# Patient Record
Sex: Female | Born: 1965 | Race: White | Hispanic: No | Marital: Married | Smoking: Former smoker
Health system: Southern US, Community
[De-identification: ages and names within clinical notes are randomized; demographics above are authoritative.]

## PROBLEM LIST (undated history)

## (undated) DIAGNOSIS — E785 Hyperlipidemia, unspecified: Secondary | ICD-10-CM

## (undated) DIAGNOSIS — I10 Essential (primary) hypertension: Secondary | ICD-10-CM

## (undated) HISTORY — DX: Essential (primary) hypertension: I10

## (undated) HISTORY — DX: Hyperlipidemia, unspecified: E78.5

---

## 2010-03-18 ENCOUNTER — Ambulatory Visit: Payer: Self-pay | Admitting: Family Medicine

## 2014-08-17 ENCOUNTER — Ambulatory Visit: Payer: Self-pay | Admitting: Physician Assistant

## 2015-09-28 ENCOUNTER — Encounter: Payer: Self-pay | Admitting: Family Medicine

## 2015-09-28 ENCOUNTER — Ambulatory Visit (INDEPENDENT_AMBULATORY_CARE_PROVIDER_SITE_OTHER): Payer: BLUE CROSS/BLUE SHIELD | Admitting: Family Medicine

## 2015-09-28 VITALS — BP 120/80 | HR 72 | Ht 68.0 in | Wt 248.0 lb

## 2015-09-28 DIAGNOSIS — J40 Bronchitis, not specified as acute or chronic: Secondary | ICD-10-CM

## 2015-09-28 DIAGNOSIS — IMO0002 Reserved for concepts with insufficient information to code with codable children: Secondary | ICD-10-CM | POA: Insufficient documentation

## 2015-09-28 DIAGNOSIS — J0100 Acute maxillary sinusitis, unspecified: Secondary | ICD-10-CM | POA: Diagnosis not present

## 2015-09-28 DIAGNOSIS — E783 Hyperchylomicronemia: Secondary | ICD-10-CM | POA: Insufficient documentation

## 2015-09-28 DIAGNOSIS — I10 Essential (primary) hypertension: Secondary | ICD-10-CM | POA: Insufficient documentation

## 2015-09-28 MED ORDER — GUAIFENESIN-CODEINE 100-10 MG/5ML PO SOLN: 5.0000 mL | Freq: Three times a day (TID) | ORAL | Status: DC | PRN

## 2015-09-28 MED ORDER — AMOXICILLIN-POT CLAVULANATE 875-125 MG PO TABS: 1.0000 | ORAL_TABLET | Freq: Two times a day (BID) | ORAL | Status: DC

## 2015-09-28 MED ORDER — ALBUTEROL SULFATE HFA 108 (90 BASE) MCG/ACT IN AERS: 2.0000 | INHALATION_SPRAY | Freq: Four times a day (QID) | RESPIRATORY_TRACT | Status: DC | PRN

## 2015-09-28 NOTE — Progress Notes (Signed)
Name: Alice Carey   MRN: [id number]    DOB: [birth year]   Date:09/28/2015       Progress Note  Subjective  Chief Complaint  Chief Complaint  Patient presents with  . Sinusitis    cough and congestion- can hear a "sloshing in R) ear"- green production x 5 days    Sinusitis This is a new problem. The current episode started in the past 7 days. The problem has been waxing and waning since onset. The maximum temperature recorded prior to her arrival was 100.4 - 100.9 F. The fever has been present for 1 to 2 days. Her pain is at a severity of 2/10. The pain is mild. Associated symptoms include chills, congestion, coughing, ear pain, sinus pressure, sneezing, a sore throat and swollen glands. Pertinent negatives include no diaphoresis, headaches, hoarse voice, neck pain or shortness of breath. ("crackling") Past treatments include acetaminophen and oral decongestants. The treatment provided mild relief.  Cough This is a new problem. The cough is productive of sputum (green). Associated symptoms include chills, ear pain, nasal congestion, postnasal drip, a sore throat and wheezing. Pertinent negatives include no chest pain, fever, headaches, heartburn, myalgias, rash, shortness of breath or weight loss. The symptoms are aggravated by cold air. She has tried OTC cough suppressant for the symptoms. The treatment provided no relief. There is no history of environmental allergies.    No problem-specific assessment & plan notes found for this encounter.   Past Medical History  Diagnosis Date  . Hyperlipidemia   . Hypertension     History reviewed. No pertinent past surgical history.  Family History  Problem Relation Age of Onset  . Hypertension Mother   . Diabetes Son     Social History   Social History  . Marital Status: Married    Spouse Name: N/A  . Number of Children: N/A  . Years of Education: N/A   Occupational History  . Not on file.   Social History Main Topics  .  Smoking status: Former Games developer  . Smokeless tobacco: Not on file  . Alcohol Use: No  . Drug Use: No  . Sexual Activity: Yes   Other Topics Concern  . Not on file   Social History Narrative  . No narrative on file    Allergies  Allergen Reactions  . Tetracyclines & Related Nausea Only     Review of Systems  Constitutional: Positive for chills. Negative for fever, weight loss, malaise/fatigue and diaphoresis.  HENT: Positive for congestion, ear pain, postnasal drip, sinus pressure, sneezing and sore throat. Negative for ear discharge and hoarse voice.   Eyes: Negative for blurred vision.  Respiratory: Positive for cough and wheezing. Negative for sputum production and shortness of breath.   Cardiovascular: Negative for chest pain, palpitations and leg swelling.  Gastrointestinal: Negative for heartburn, nausea, abdominal pain, diarrhea, constipation, blood in stool and melena.  Genitourinary: Negative for dysuria, urgency, frequency and hematuria.  Musculoskeletal: Negative for myalgias, back pain, joint pain and neck pain.  Skin: Negative for rash.  Neurological: Negative for dizziness, tingling, sensory change, focal weakness and headaches.  Endo/Heme/Allergies: Negative for environmental allergies and polydipsia. Does not bruise/bleed easily.  Psychiatric/Behavioral: Negative for depression and suicidal ideas. The patient is not nervous/anxious and does not have insomnia.      Objective  Filed Vitals:   09/28/15 1549  BP: 120/80  Pulse: 72  Height:  (1.727 m)  Weight: 248 lb (112.492 kg)    Physical Exam  Constitutional: She is well-developed, well-nourished, and in no distress. No distress.  HENT:  Head: Normocephalic and atraumatic.  Right Ear: External ear normal.  Left Ear: External ear normal.  Nose: Nose normal.  Mouth/Throat: Oropharynx is clear and moist.  Eyes: Conjunctivae and EOM are normal. Pupils are equal, round, and reactive to light. Right eye  exhibits no discharge. Left eye exhibits no discharge.  Neck: Normal range of motion. Neck supple. No JVD present. No thyromegaly present.  Cardiovascular: Normal rate, regular rhythm, normal heart sounds and intact distal pulses.  Exam reveals no gallop and no friction rub.   No murmur heard. Pulmonary/Chest: Effort normal and breath sounds normal.  Abdominal: Soft. Bowel sounds are normal. She exhibits no mass. There is no tenderness. There is no guarding.  Musculoskeletal: Normal range of motion. She exhibits no edema.  Lymphadenopathy:    She has no cervical adenopathy.  Neurological: She is alert.  Skin: Skin is warm and dry. She is not diaphoretic.  Psychiatric: Mood and affect normal.  Nursing note and vitals reviewed.     Assessment & Plan  Problem List Items Addressed This Visit    None    Visit Diagnoses    Acute maxillary sinusitis, recurrence not specified    -  Primary    Relevant Medications    doxycycline (VIBRA-TABS) 100 MG tablet    amoxicillin-clavulanate (AUGMENTIN) 875-125 MG tablet    guaiFENesin-codeine 100-10 MG/5ML syrup    Bronchitis        Relevant Medications    guaiFENesin-codeine 100-10 MG/5ML syrup         Dr. Hayden Rasmusseneanna Anagabriela Jokerst [facility]  09/28/2015

## 2015-10-07 ENCOUNTER — Encounter: Payer: Self-pay | Admitting: Family Medicine

## 2015-10-07 ENCOUNTER — Ambulatory Visit (INDEPENDENT_AMBULATORY_CARE_PROVIDER_SITE_OTHER): Payer: BLUE CROSS/BLUE SHIELD | Admitting: Family Medicine

## 2015-10-07 VITALS — BP 100/64 | HR 68 | Ht 68.0 in | Wt 248.0 lb

## 2015-10-07 DIAGNOSIS — J0100 Acute maxillary sinusitis, unspecified: Secondary | ICD-10-CM

## 2015-10-07 DIAGNOSIS — J219 Acute bronchiolitis, unspecified: Secondary | ICD-10-CM | POA: Diagnosis not present

## 2015-10-07 MED ORDER — BENZONATATE 100 MG PO CAPS: 100.0000 mg | ORAL_CAPSULE | Freq: Two times a day (BID) | ORAL | Status: DC | PRN

## 2015-10-07 MED ORDER — GUAIFENESIN-CODEINE 100-10 MG/5ML PO SOLN: 5.0000 mL | Freq: Three times a day (TID) | ORAL | Status: DC | PRN

## 2015-10-07 NOTE — Progress Notes (Signed)
Name: Alice Carey   MRN: [id number]    DOB: Jun 19, 1966   Date:10/07/2015       Progress Note  Subjective  Chief Complaint  Chief Complaint  Patient presents with  . Cough    Cough This is a recurrent problem. The current episode started 1 to 4 weeks ago. The problem has been waxing and waning. The problem occurs constantly. The cough is non-productive. Associated symptoms include nasal congestion, postnasal drip and rhinorrhea. Pertinent negatives include no chest pain, chills, ear congestion, ear pain, fever, headaches, heartburn, hemoptysis, myalgias, rash, sore throat, shortness of breath, sweats, weight loss or wheezing. The symptoms are aggravated by cold air and lying down. She has tried a beta-agonist inhaler for the symptoms. The treatment provided mild relief. Her past medical history is significant for asthma and bronchitis. There is no history of COPD or environmental allergies.    No problem-specific assessment & plan notes found for this encounter.   Past Medical History  Diagnosis Date  . Hyperlipidemia   . Hypertension     History reviewed. No pertinent past surgical history.  Family History  Problem Relation Age of Onset  . Hypertension Mother   . Diabetes Son     Social History   Social History  . Marital Status: Married    Spouse Name: N/A  . Number of Children: N/A  . Years of Education: N/A   Occupational History  . Not on file.   Social History Main Topics  . Smoking status: Former Games developer  . Smokeless tobacco: Not on file  . Alcohol Use: No  . Drug Use: No  . Sexual Activity: Yes   Other Topics Concern  . Not on file   Social History Narrative    Allergies  Allergen Reactions  . Tetracyclines & Related Nausea Only     Review of Systems  Constitutional: Negative for fever, chills, weight loss and malaise/fatigue.  HENT: Positive for postnasal drip and rhinorrhea. Negative for ear discharge, ear pain and sore throat.   Eyes:  Negative for blurred vision.  Respiratory: Positive for cough. Negative for hemoptysis, sputum production, shortness of breath and wheezing.   Cardiovascular: Negative for chest pain, palpitations and leg swelling.  Gastrointestinal: Negative for heartburn, nausea, abdominal pain, diarrhea, constipation, blood in stool and melena.  Genitourinary: Negative for dysuria, urgency, frequency and hematuria.  Musculoskeletal: Negative for myalgias, back pain, joint pain and neck pain.  Skin: Negative for rash.  Neurological: Negative for dizziness, tingling, sensory change, focal weakness and headaches.  Endo/Heme/Allergies: Negative for environmental allergies and polydipsia. Does not bruise/bleed easily.  Psychiatric/Behavioral: Negative for depression and suicidal ideas. The patient is not nervous/anxious and does not have insomnia.      Objective  Filed Vitals:   10/07/15 1545  BP: 100/64  Pulse: 68  Height:  (1.727 m)  Weight: 248 lb (112.492 kg)  SpO2: 97%    Physical Exam  Constitutional: She is well-developed, well-nourished, and in no distress. No distress.  HENT:  Head: Normocephalic and atraumatic.  Right Ear: External ear normal.  Left Ear: External ear normal.  Nose: Nose normal.  Mouth/Throat: Oropharynx is clear and moist.  Eyes: Conjunctivae and EOM are normal. Pupils are equal, round, and reactive to light. Right eye exhibits no discharge. Left eye exhibits no discharge.  Neck: Normal range of motion. Neck supple. No JVD present. No thyromegaly present.  Cardiovascular: Normal rate, regular rhythm, normal heart sounds and intact distal pulses.  Exam reveals  no gallop and no friction rub.   No murmur heard. Pulmonary/Chest: Effort normal and breath sounds normal. No respiratory distress. She has no wheezes. She has no rales. She exhibits no tenderness.  Abdominal: Soft. Bowel sounds are normal. She exhibits no mass. There is no tenderness. There is no guarding.   Musculoskeletal: Normal range of motion. She exhibits no edema.  Lymphadenopathy:    She has no cervical adenopathy.  Neurological: She is alert. She has normal reflexes.  Skin: Skin is warm and dry. She is not diaphoretic.  Psychiatric: Mood and affect normal.  Nursing note and vitals reviewed.     Assessment & Plan  Problem List Items Addressed This Visit    None    Visit Diagnoses    Bronchiolitis    -  Primary    Breo sample    Acute maxillary sinusitis, recurrence not specified        Relevant Medications    benzonatate (TESSALON) 100 MG capsule    guaiFENesin-codeine 100-10 MG/5ML syrup         Dr. Hayden Rasmusseneanna Elie Leppo [facility]  10/07/2015

## 2016-01-27 IMAGING — CR RIGHT FOOT COMPLETE - 3+ VIEW
3 series · 3 of 3 positions shown · non-contrast
Comparison: None.

CLINICAL DATA: Pain on the top of the right midfoot for 3 weeks. No
known injury. Remote history of a right ankle fracture.

EXAM:
RIGHT FOOT COMPLETE - 3+ VIEW

[foot ap]
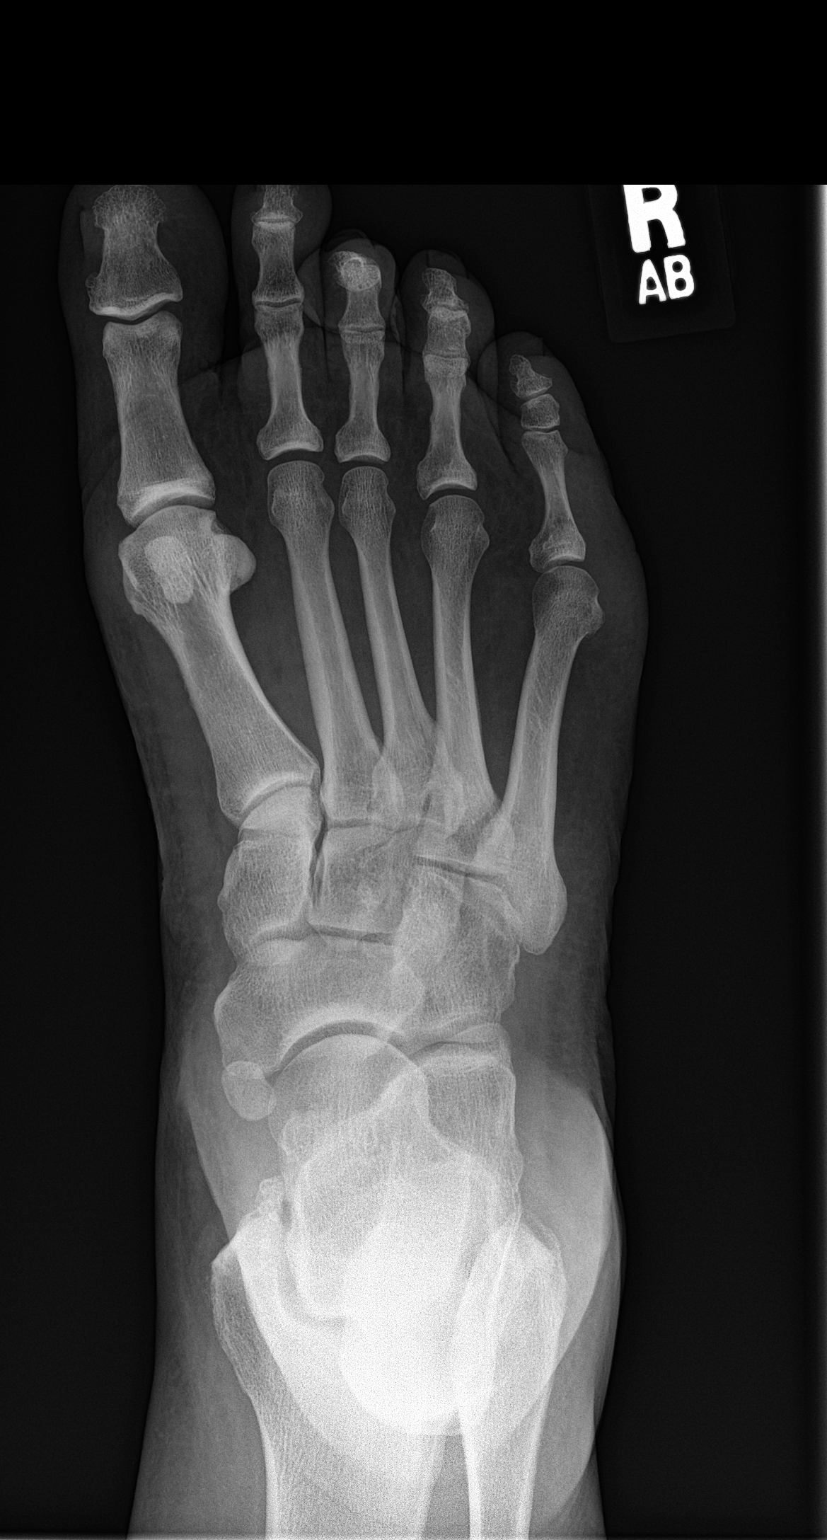

[foot obl]
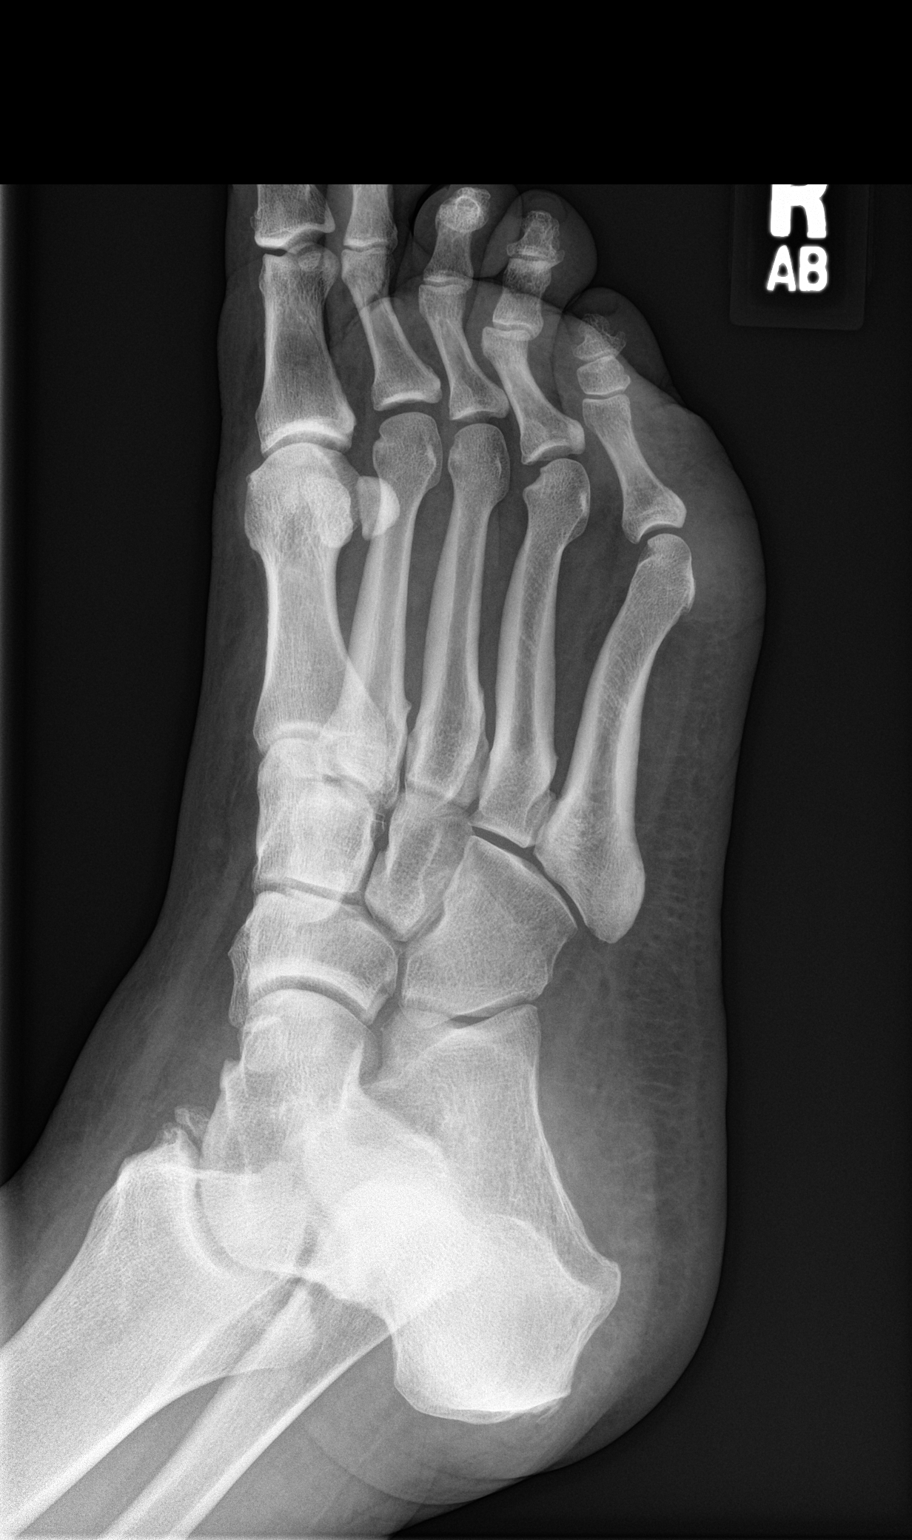

[foot lat]
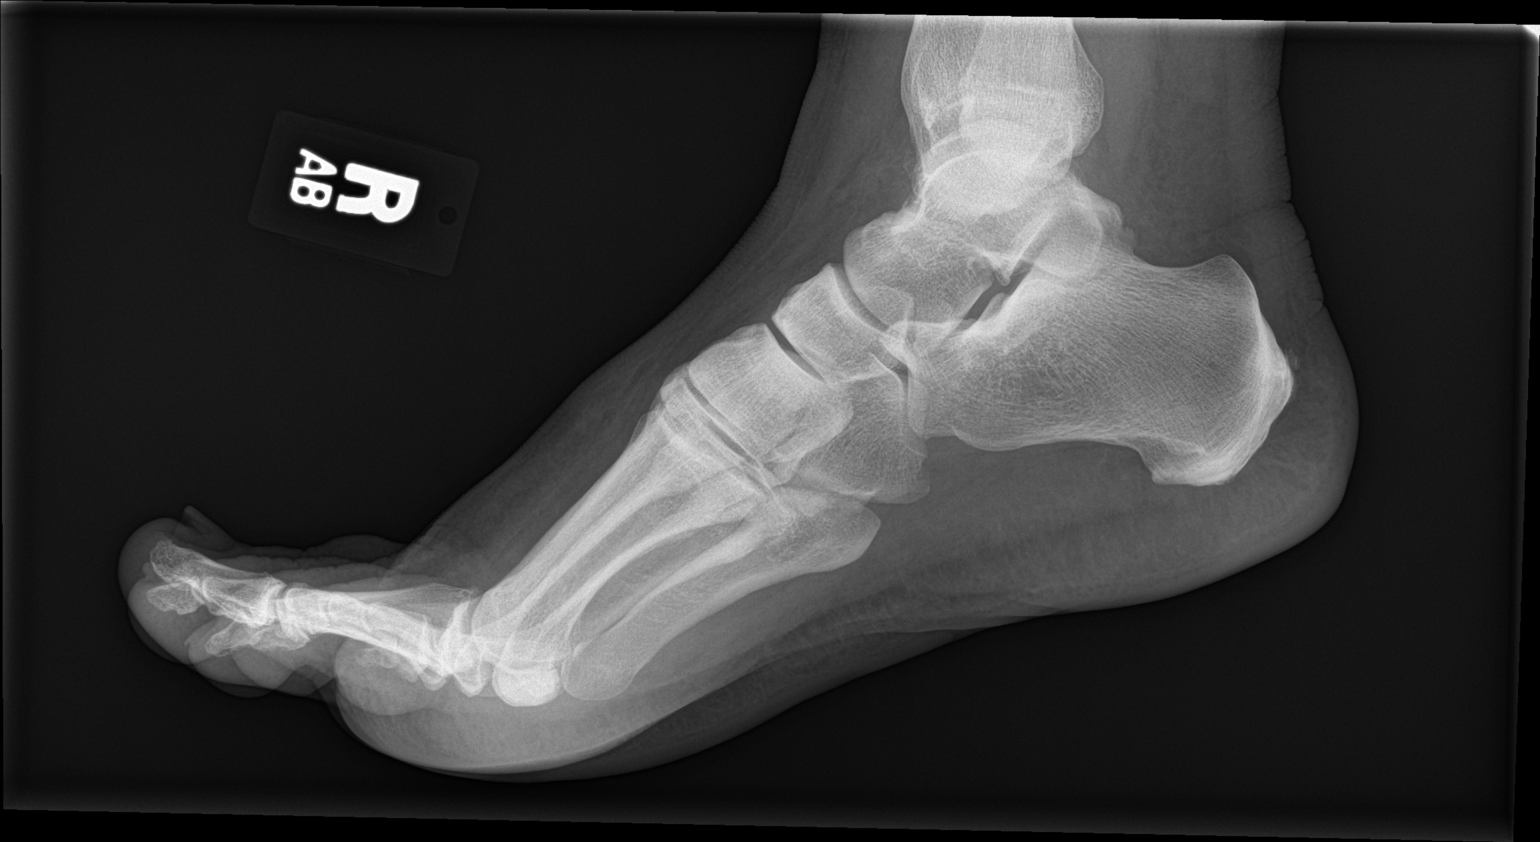

[3 of 3 positions shown; findings below may reference images not displayed]

FINDINGS: No acute fracture. Joints are normally spaced and aligned. Mild
diffuse soft tissue edema is noted.
IMPRESSION: No fracture or joint abnormality.

## 2016-10-23 ENCOUNTER — Ambulatory Visit (INDEPENDENT_AMBULATORY_CARE_PROVIDER_SITE_OTHER): Payer: BLUE CROSS/BLUE SHIELD

## 2016-10-23 ENCOUNTER — Ambulatory Visit
Admission: EM | Admit: 2016-10-23 | Discharge: 2016-10-23 | Discharge status: Against Medical Advice | Payer: BLUE CROSS/BLUE SHIELD | Attending: Emergency Medicine | Admitting: Emergency Medicine

## 2016-10-23 DIAGNOSIS — J189 Pneumonia, unspecified organism: Secondary | ICD-10-CM

## 2016-10-23 LAB — CBC WITH DIFFERENTIAL/PLATELET
BASOS PCT: 0 %
Basophils Absolute: 0 10*3/uL (ref 0–0.1)
Eosinophils Absolute: 0 10*3/uL (ref 0–0.7)
Eosinophils Relative: 0 %
HEMATOCRIT: 40.4 % (ref 35.0–47.0)
Hemoglobin: 13.9 g/dL (ref 12.0–16.0)
LYMPHS ABS: 0.8 10*3/uL — AB (ref 1.0–3.6)
Lymphocytes Relative: 7 %
MCH: 31.6 pg (ref 26.0–34.0)
MCHC: 34.3 g/dL (ref 32.0–36.0)
MCV: 92.2 fL (ref 80.0–100.0)
MONO ABS: 1.1 10*3/uL — AB (ref 0.2–0.9)
MONOS PCT: 10 %
NEUTROS ABS: 9.3 10*3/uL — AB (ref 1.4–6.5)
Neutrophils Relative %: 83 %
Platelets: 280 10*3/uL (ref 150–440)
RBC: 4.38 MIL/uL (ref 3.80–5.20)
RDW: 13.2 % (ref 11.5–14.5)
WBC: 11.3 10*3/uL — ABNORMAL HIGH (ref 3.6–11.0)

## 2016-10-23 LAB — BASIC METABOLIC PANEL
Anion gap: 13 (ref 5–15)
BUN: 29 mg/dL — AB (ref 6–20)
CALCIUM: 9 mg/dL (ref 8.9–10.3)
CHLORIDE: 94 mmol/L — AB (ref 101–111)
CO2: 29 mmol/L (ref 22–32)
CREATININE: 1 mg/dL (ref 0.44–1.00)
GFR calc non Af Amer: >60 mL/min (ref >60)
GLUCOSE: 128 mg/dL — AB (ref 65–99)
Potassium: 3.4 mmol/L — ABNORMAL LOW (ref 3.5–5.1)
Sodium: 136 mmol/L (ref 135–145)

## 2016-10-23 LAB — RAPID INFLUENZA A&B ANTIGENS (ARMC ONLY)
INFLUENZA A (ARMC): NEGATIVE
INFLUENZA B (ARMC): NEGATIVE

## 2016-10-23 MED ORDER — LEVOFLOXACIN 750 MG PO TABS: 750.0000 mg | ORAL_TABLET | Freq: Every day | ORAL | 0 refills | Status: AC

## 2016-10-23 MED ORDER — SODIUM CHLORIDE 0.9 % IV BOLUS (SEPSIS)
1000.0000 mL | Freq: Once | INTRAVENOUS | Status: AC
  Administered 2016-10-23: 1000 mL via INTRAVENOUS

## 2016-10-23 MED ORDER — GUAIFENESIN-CODEINE 100-10 MG/5ML PO SYRP: 5.0000 mL | ORAL_SOLUTION | Freq: Four times a day (QID) | ORAL | 0 refills | Status: DC | PRN

## 2016-10-23 MED ORDER — ONDANSETRON 8 MG PO TBDP: 8.0000 mg | ORAL_TABLET | Freq: Three times a day (TID) | ORAL | 0 refills | Status: AC | PRN

## 2016-10-23 MED ORDER — LEVOFLOXACIN 750 MG PO TABS
750.0000 mg | ORAL_TABLET | Freq: Every day | ORAL | Status: DC
  Administered 2016-10-23: 750 mg via ORAL

## 2016-10-23 MED ORDER — ACETAMINOPHEN 500 MG PO TABS
1000.0000 mg | ORAL_TABLET | Freq: Once | ORAL | Status: AC
  Administered 2016-10-23: 1000 mg via ORAL

## 2016-10-23 NOTE — Discharge Instructions (Signed)
2 puffs from your albuterol inhaler every 4-6 hours as needed for coughing, wheezing. Make sure you finish all of the antibiotics. You may start the antibiotics tomorrow as you have already taken today's dose. Push fluids. Go to the ER for persistent heart rate above 100, if your systolic blood pressure drops below 90, fevers despite being on the antibiotics, or other concerns.

## 2016-10-23 NOTE — ED Provider Notes (Signed)
HPI  SUBJECTIVE:  Alice Carey is a 51 y.o. female who presents with fevers Tmax 103 with chills, fatigue, cough productive of white phlegm, shortness of breath, wheezing last night, and dyspnea on exertion at 20 feet. She reports diffuse, constant chest pain described as burning, sore, pressure with no exertional positional component. There is no diaphoresis with this. She reports "entire back pain", body aches, frontal headache that is intermittent, present only with coughing, lasting a minute or 2 and then resolving. She denies neck stiffness or photophobia. She reports nausea, decreased appetite, but is tolerating by mouth. She reports posttussive emesis, but no other vomiting. Reports abdominal soreness from all the coughing, denies abdominal pain. No diarrhea. She denies lower extremity edema, calf pain, swelling, nocturia, PND, orthopnea. She has been taking Tylenol thousand milligrams and Advil 800 mg. Tylenol helps. Last dose of Tylenol was 3 hours prior to evaluation. No aggravating factors. She states that she was treated prophylactically with Tamiflu last week because several family members had influenza, but she states that she is getting worse. She has a past medical history of hypertension, normally runs 120/70. Patient states that she is not taken her medication in a week. She has remote history of pneumonia. No history of diabetes, asthma, emphysema, COPD. She is not a smoker. History of immunocompromise, HIV, cancer, UTI, pyelonephritis, hypercholesterolemia, MI, CHF. PMD: Dr. Elmer Ramp    Past Medical History:  Diagnosis Date  . Hyperlipidemia   . Hypertension     History reviewed. No pertinent surgical history.  Family History  Problem Relation Age of Onset  . Hypertension Mother   . Diabetes Son     Social History  Substance Use Topics  . Smoking status: Former Games developer  . Smokeless tobacco: Never Used  . Alcohol use No    No current facility-administered  medications for this encounter.   Current Outpatient Prescriptions:  .  albuterol (PROVENTIL HFA;VENTOLIN HFA) 108 (90 BASE) MCG/ACT inhaler, Inhale 2 puffs into the lungs every 6 (six) hours as needed for wheezing or shortness of breath., Disp: 1 Inhaler, Rfl: 2 .  amLODipine (NORVASC) 10 MG tablet, Take 1 tablet by mouth daily. Dr Elmer Ramp, Disp: , Rfl:  .  atenolol (TENORMIN) 50 MG tablet, Take 1 tablet by mouth daily., Disp: , Rfl:  .  gemfibrozil (LOPID) 600 MG tablet, Take 1 tablet by mouth 2 (two) times daily., Disp: , Rfl:  .  hydrochlorothiazide (HYDRODIURIL) 25 MG tablet, Take 1 tablet by mouth daily., Disp: , Rfl:  .  oseltamivir (TAMIFLU) 30 MG capsule, Take 30 mg by mouth., Disp: , Rfl:  .  telmisartan (MICARDIS) 80 MG tablet, Take 1 tablet by mouth daily., Disp: , Rfl:   Allergies  Allergen Reactions  . Tetracyclines & Related Nausea Only     ROS  As noted in HPI.   Physical Exam  BP (!) 93/55   Pulse 100   Temp 98.9 F (37.2 C) (Oral)   Resp 18   Ht 5\' 7"  (1.702 m)   Wt 240 lb (108.9 kg)   LMP 09/06/2015 (Exact Date) Comment: denies preg  SpO2 99%   BMI 37.59 kg/m  Vitals:   10/23/16 0927 10/23/16 0928 10/23/16 1022 10/23/16 1315  BP: (!) 93/51  (!) 93/55 (!) 108/57  Pulse: (!) 110  100 (!) 102  Resp: 18   20  Temp: 98.9 F (37.2 C)   100 F (37.8 C)  TempSrc: Oral     SpO2: 99%  99%  Weight:  240 lb (108.9 kg)    Height:  5\' 7"  (1.702 m)     BP Readings from Last 3 Encounters:  10/23/16 (!) 93/55  10/07/15 100/64  09/28/15 120/80   06/27/2016 125/70 12/13/2015 114/70  Constitutional: Well developed, well nourished, no acute distress Appears ill. Coughing.  Eyes: PERRL, EOMI, conjunctiva normal bilaterally HENT: Normocephalic, atraumatic,mucus membranes moist. Mild nasal congestion. No sinus tenderness. Respiratory: Crackles right lower lobe, no rales, no wheezing, no rhonchi. Diffuse chest wall tenderness  Cardiovascular. regular tachycardia, no  murmurs, no gallops, no rubs. Radial pulse 2+ and regular.  GI: Soft, nondistended, normal bowel sounds, nontender, no rebound, no guarding Back: no CVAT skin: No rash, skin intact Musculoskeletal: no deformities Neurologic: Alert & oriented x 3, CN II-XII grossly intact, no motor deficits, sensation grossly intact Psychiatric: Speech and behavior appropriate   ED Course   Medications - No data to display  Orders Placed This Encounter  Procedures  . Rapid Influenza A&B Antigens ([facility] only)    Standing Status:   Standing    Number of Occurrences:   1  . DG Chest 2 View    Standing Status:   Standing    Number of Occurrences:   1    Order Specific Question:   Reason for Exam (SYMPTOM  OR DIAGNOSIS REQUIRED)    Answer:   fever cough crackles RLL r/o PNA  . CBC with Differential    Standing Status:   Standing    Number of Occurrences:   1  . Basic metabolic panel    Standing Status:   Standing    Number of Occurrences:   1  . Vital signs    Standing Status:   Standing    Number of Occurrences:   1  . Droplet precaution    Standing Status:   Standing    Number of Occurrences:   1   Results for orders placed or performed during the hospital encounter of 10/23/16 (from the past 24 hour(s))  Rapid Influenza A&B Antigens ([facility] only)     Status: None   Collection Time: 10/23/16  9:28 AM  Result Value Ref Range   Influenza A ([facility]) NEGATIVE NEGATIVE   Influenza B ([facility]) NEGATIVE NEGATIVE  CBC with Differential     Status: Abnormal   Collection Time: 10/23/16 10:11 AM  Result Value Ref Range   WBC 11.3 (H) 3.6 - 11.0 K/uL   RBC 4.38 3.80 - 5.20 MIL/uL   Hemoglobin 13.9 12.0 - 16.0 g/dL   HCT 40.9 81.1 - 91.4 %   MCV 92.2 80.0 - 100.0 fL   MCH 31.6 26.0 - 34.0 pg   MCHC 34.3 32.0 - 36.0 g/dL   RDW 78.2 95.6 - 21.3 %   Platelets 280 150 - 440 K/uL   Neutrophils Relative % 83 %   Neutro Abs 9.3 (H) 1.4 - 6.5 K/uL   Lymphocytes Relative 7 %   Lymphs Abs 0.8 (L) 1.0 - 3.6  K/uL   Monocytes Relative 10 %   Monocytes Absolute 1.1 (H) 0.2 - 0.9 K/uL   Eosinophils Relative 0 %   Eosinophils Absolute 0.0 0 - 0.7 K/uL   Basophils Relative 0 %   Basophils Absolute 0.0 0 - 0.1 K/uL  Basic metabolic panel     Status: Abnormal   Collection Time: 10/23/16 10:11 AM  Result Value Ref Range   Sodium 136 135 - 145 mmol/L   Potassium 3.4 (L) 3.5 -  5.1 mmol/L   Chloride 94 (L) 101 - 111 mmol/L   CO2 29 22 - 32 mmol/L   Glucose, Bld 128 (H) 65 - 99 mg/dL   BUN 29 (H) 6 - 20 mg/dL   Creatinine, Ser 4.091.00 0.44 - 1.00 mg/dL   Calcium 9.0 8.9 - 81.110.3 mg/dL   GFR calc non Af Amer >60 >60 mL/min   GFR calc Af Amer >60 >60 mL/min   Anion gap 13 5 - 15   Dg Chest 2 View  Result Date: 10/23/2016 CLINICAL DATA:  Productive cough and fever and shortness of breath. EXAM: CHEST  2 VIEW COMPARISON:  None. FINDINGS: There is a patchy pneumonia in the right middle lobe as well as in the right lower lobe. Left lung is clear. Borderline cardiomegaly. Pulmonary vascularity is normal. No bone abnormality. IMPRESSION: Patchy pneumonias in the right middle and lower lobes. Electronically Signed   By: Francene BoyersJames  Maxwell M.D.   On: 10/23/2016 10:37    ED Clinical Impression  No diagnosis found.   ED Assessment/Plan  Previous records reviewed. No documentation of recent treatment with influenza in the [facility]. Additional blood pressures obtained.  Afebrile, tachycardic with a low blood pressure compared to previous visits. This is concerning given that she has not taken her blood pressure medicine in a week. Concern for pneumonia. Checking chest x-ray, CBC, BMP to assess kidney function. We'll recheck BP. Doubt UTI, meningitis, sinusitis, otitis, or intra-abdominal process.  Reviewed imaging independently. Positive right middle lobe and right lower lobe pneumonia. See radiology report for details.  Patients curb 65 score is 2 out of 5, she has a 13% risk of mortality, short hospitalization  to initiate therapy is indicated. Discussed going to the ED with patient.  [phone] paging hospitalist [phone]- paging Dr. Elpidio AnisSudini, hospitalist. D/w him-  agrees for admission.   Pt repeatedly refusing to go to the ED, wants to be treated at home.  We'll give IVF bolus and see if her blood pressure and tachycardia improves. Giving Levaquin 750 mg by mouth here.   Reevaluation, patient has a mild fever, giving Tylenol 1 g by mouth. Blood pressure has improved. She still has mild tachycardia. She states that she feels better. Appears better. Pt going AMA but We will send her home with Levaquin 750 mg by mouth for the next 7 days. Cheratussin, Zofran per request. She will monitor her blood pressure and heart rate at home, push fluids. She agrees to go to the ED for persistent tachycardia, hypotension, persistent fevers, shortness of breath, if she gets worse, or for any concerns. Pt agrees with plan.   Meds ordered this encounter  Medications  . oseltamivir (TAMIFLU) 30 MG capsule    Sig: Take 30 mg by mouth.    *This clinic note was created using Dragon dictation software. Therefore, there may be occasional mistakes despite careful proofreading.  ?   Domenick GongAshley Kambria Grima, MD 10/23/16 [phone]

## 2016-10-23 NOTE — ED Triage Notes (Addendum)
Pt was seen and treated for the flu last week and was treated with tamiflu. Others in the family seem to be getting better however she is still experiencing fever, chills, burning in her chest and nausea. She states she is having pain in her back and a headache.

## 2017-01-08 ENCOUNTER — Ambulatory Visit
Admission: EM | Admit: 2017-01-08 | Discharge: 2017-01-08 | Disposition: A | Discharge status: Home / Self Care | Payer: BLUE CROSS/BLUE SHIELD | Attending: Family Medicine | Admitting: Family Medicine

## 2017-01-08 ENCOUNTER — Encounter: Payer: Self-pay | Admitting: Emergency Medicine

## 2017-01-08 DIAGNOSIS — J0100 Acute maxillary sinusitis, unspecified: Secondary | ICD-10-CM

## 2017-01-08 MED ORDER — AZITHROMYCIN 250 MG PO TABS: ORAL_TABLET | ORAL | 0 refills | Status: DC

## 2017-01-08 MED ORDER — GUAIFENESIN-CODEINE 100-10 MG/5ML PO SOLN: 10.0000 mL | Freq: Four times a day (QID) | ORAL | 0 refills | Status: DC | PRN

## 2017-01-08 NOTE — ED Triage Notes (Signed)
Patient c/o sinus pain and congestion for over a week.  Patient denies fevers.

## 2017-01-08 NOTE — ED Provider Notes (Signed)
[facility]    CSN: [id number] Arrival date & time: 01/08/17  1538     History   Chief Complaint Chief Complaint  Patient presents with  . Sinus Problem    HPI Alice Carey is a 51 y.o. female.   The history is provided by the patient.  Sinus Problem  Associated symptoms include headaches.  URI  Presenting symptoms: congestion, facial pain and fatigue   Severity:  Moderate Onset quality:  Sudden Duration:  10 days Timing:  Constant Progression:  Worsening Chronicity:  New Relieved by:  Nothing Ineffective treatments:  OTC medications Associated symptoms: headaches and sinus pain   Associated symptoms: no wheezing   Risk factors: not elderly, no chronic cardiac disease, no chronic kidney disease, no chronic respiratory disease, no diabetes mellitus, no immunosuppression, no recent illness, no recent travel and no sick contacts     Past Medical History:  Diagnosis Date  . Hyperlipidemia   . Hypertension     Patient Active Problem List   Diagnosis Date Noted  . BP (high blood pressure) 09/28/2015  . 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 Adult BMI 30+ 09/28/2015    History reviewed. No pertinent surgical history.  OB History    No data available       Home Medications    Prior to Admission medications   Medication Sig Start Date End Date Taking? Authorizing Provider  albuterol (PROVENTIL HFA;VENTOLIN HFA) 108 (90 BASE) MCG/ACT inhaler Inhale 2 puffs into the lungs every 6 (six) hours as needed for wheezing or shortness of breath. 09/28/15   Duanne Limerickeanna C Jones, MD  amLODipine (NORVASC) 10 MG tablet Take 1 tablet by mouth daily. Dr Elmer RampVirk 11/24/14   Historical Provider, MD  atenolol (TENORMIN) 50 MG tablet Take 1 tablet by mouth daily. 11/24/14 10/23/16  Historical Provider, MD  azithromycin (ZITHROMAX Z-PAK) 250 MG tablet 2 tabs po once day 1, then 1 tab po qd for next 4 days 01/08/17   Payton Mccallumrlando Shawnice Tilmon, MD  guaiFENesin-codeine 100-10  MG/5ML syrup Take 10 mLs by mouth every 6 (six) hours as needed for cough. 01/08/17   Payton Mccallumrlando Casmer Yepiz, MD  hydrochlorothiazide (HYDRODIURIL) 25 MG tablet Take 1 tablet by mouth daily. 11/24/14 10/23/16  Historical Provider, MD  ondansetron (ZOFRAN ODT) 8 MG disintegrating tablet Take 1 tablet (8 mg total) by mouth every 8 (eight) hours as needed for nausea or vomiting. 10/23/16   Domenick GongAshley Mortenson, MD  telmisartan (MICARDIS) 80 MG tablet Take 1 tablet by mouth daily. 11/24/14 10/23/16  Historical Provider, MD    Family History Family History  Problem Relation Age of Onset  . Hypertension Mother   . Diabetes Son     Social History Social History  Substance Use Topics  . Smoking status: Former Games developermoker  . Smokeless tobacco: Never Used  . Alcohol use No     Allergies   Tetracyclines & related   Review of Systems Review of Systems  Constitutional: Positive for fatigue.  HENT: Positive for congestion and sinus pain.   Respiratory: Negative for wheezing.   Neurological: Positive for headaches.     Physical Exam Triage Vital Signs ED Triage Vitals  Enc Vitals Group     BP 01/08/17 1618 103/60     Pulse Rate 01/08/17 1618 60     Resp 01/08/17 1618 16     Temp 01/08/17 1618 98.5 F (36.9 C)     Temp Source 01/08/17 1618 Oral     SpO2 01/08/17 1618 96 %  Weight 01/08/17 1616 250 lb (113.4 kg)     Height 01/08/17 1616 5\' 8"  (1.727 m)     Head Circumference --      Peak Flow --      Pain Score 01/08/17 1617 0     Pain Loc --      Pain Edu? --      Excl. in GC? --    No data found.   Updated Vital Signs BP 103/60 (BP Location: Left Arm)   Pulse 60   Temp 98.5 F (36.9 C) (Oral)   Resp 16   Ht 5\' 8"  (1.727 m)   Wt 250 lb (113.4 kg)   LMP 09/06/2015 (Exact Date) Comment: denies preg  SpO2 96%   BMI 38.01 kg/m   Visual Acuity Right Eye Distance:   Left Eye Distance:   Bilateral Distance:    Right Eye Near:   Left Eye Near:    Bilateral Near:     Physical Exam    Constitutional: She appears well-developed and well-nourished. No distress.  HENT:  Head: Normocephalic and atraumatic.  Right Ear: Tympanic membrane, external ear and ear canal normal.  Left Ear: Tympanic membrane, external ear and ear canal normal.  Nose: Mucosal edema and rhinorrhea present. No nose lacerations, sinus tenderness, nasal deformity, septal deviation or nasal septal hematoma. No epistaxis.  No foreign bodies. Right sinus exhibits maxillary sinus tenderness and frontal sinus tenderness. Left sinus exhibits maxillary sinus tenderness and frontal sinus tenderness.  Mouth/Throat: Uvula is midline, oropharynx is clear and moist and mucous membranes are normal. No oropharyngeal exudate.  Eyes: Conjunctivae and EOM are normal. Pupils are equal, round, and reactive to light. Right eye exhibits no discharge. Left eye exhibits no discharge. No scleral icterus.  Neck: Normal range of motion. Neck supple. No thyromegaly present.  Cardiovascular: Normal rate, regular rhythm and normal heart sounds.   Pulmonary/Chest: Effort normal and breath sounds normal. No respiratory distress. She has no wheezes. She has no rales.  Lymphadenopathy:    She has no cervical adenopathy.  Skin: She is not diaphoretic.  Nursing note and vitals reviewed.    UC Treatments / Results  Labs (all labs ordered are listed, but only abnormal results are displayed) Labs Reviewed - No data to display  EKG  EKG Interpretation None       Radiology No results found.  Procedures Procedures (including critical care time)  Medications Ordered in UC Medications - No data to display   Initial Impression / Assessment and Plan / UC Course  I have reviewed the triage vital signs and the nursing notes.  Pertinent labs & imaging results that were available during my care of the patient were reviewed by me and considered in my medical decision making (see chart for details).       Final Clinical  Impressions(s) / UC Diagnoses   Final diagnoses:  Acute maxillary sinusitis, recurrence not specified    New Prescriptions Discharge Medication List as of 01/08/2017  5:13 PM    START taking these medications   Details  azithromycin (ZITHROMAX Z-PAK) 250 MG tablet 2 tabs po once day 1, then 1 tab po qd for next 4 days, Normal    guaiFENesin-codeine 100-10 MG/5ML syrup Take 10 mLs by mouth every 6 (six) hours as needed for cough., Starting Mon 01/08/2017, Print       1 diagnosis reviewed with patient 2. rx as per orders above; reviewed possible side effects, interactions, risks and benefits  3.  Recommend supportive treatment with otc flonase 4. Follow-up prn if symptoms worsen or don't improve   Payton Mccallum, MD 01/08/17 1844

## 2021-03-04 ENCOUNTER — Ambulatory Visit
Admission: EM | Admit: 2021-03-04 | Discharge: 2021-03-04 | Disposition: A | Discharge status: Home / Self Care | Payer: BC Managed Care – PPO | Attending: Sports Medicine | Admitting: Sports Medicine

## 2021-03-04 ENCOUNTER — Encounter: Payer: Self-pay | Admitting: Emergency Medicine

## 2021-03-04 ENCOUNTER — Other Ambulatory Visit: Payer: Self-pay

## 2021-03-04 DIAGNOSIS — R0789 Other chest pain: Secondary | ICD-10-CM | POA: Diagnosis not present

## 2021-03-04 DIAGNOSIS — J40 Bronchitis, not specified as acute or chronic: Secondary | ICD-10-CM

## 2021-03-04 DIAGNOSIS — R059 Cough, unspecified: Secondary | ICD-10-CM

## 2021-03-04 DIAGNOSIS — J302 Other seasonal allergic rhinitis: Secondary | ICD-10-CM

## 2021-03-04 DIAGNOSIS — J4520 Mild intermittent asthma, uncomplicated: Secondary | ICD-10-CM

## 2021-03-04 DIAGNOSIS — R0981 Nasal congestion: Secondary | ICD-10-CM

## 2021-03-04 DIAGNOSIS — R062 Wheezing: Secondary | ICD-10-CM

## 2021-03-04 MED ORDER — AZITHROMYCIN 250 MG PO TABS: ORAL_TABLET | ORAL | 0 refills | Status: AC

## 2021-03-04 MED ORDER — PREDNISONE 20 MG PO TABS: 20.0000 mg | ORAL_TABLET | Freq: Two times a day (BID) | ORAL | 0 refills | Status: AC

## 2021-03-04 MED ORDER — PROMETHAZINE-DM 6.25-15 MG/5ML PO SYRP: 5.0000 mL | ORAL_SOLUTION | Freq: Four times a day (QID) | ORAL | 0 refills | Status: AC | PRN

## 2021-03-04 MED ORDER — ALBUTEROL SULFATE HFA 108 (90 BASE) MCG/ACT IN AERS: 2.0000 | INHALATION_SPRAY | Freq: Four times a day (QID) | RESPIRATORY_TRACT | 2 refills | Status: AC | PRN

## 2021-03-04 NOTE — Discharge Instructions (Addendum)
As we discussed, your cough is secondary to bronchitis.  I am treating with antibiotic.  You are wheezing on exam and I am giving her some steroids.  You are going to need to use your inhaler and you tell me that you do not have a current one.  I have also renewed that for you as well.  Complicating her situation is your asthma diagnosis so if your symptoms get worse please call 911 or go to ER.  He also seasonal allergies so continue with the Flonase and the oral medications for this until we get out of the current pollen season. I gave you a work note saying that you were seen today go back to work Monday.  I suspect she will be feeling much better.  If you are not please contact your primary care provider.  Again if you are worse go to the ER. Plenty of rest, plenty of fluids, Tylenol or Motrin for any fever or discomfort.

## 2021-03-04 NOTE — ED Provider Notes (Signed)
[facility]    CSN: [id number] Arrival date & time: 03/04/21  1132      History   Chief Complaint Chief Complaint  Patient presents with  . Cough    Chest Congestion  . Shortness of Breath    HPI Alice Carey is a 55 y.o. female.    55 year old female presents for evaluation of the above issue.  Normally sees [facility] in [place] but they were unavailable to see her.  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  She reports 2 to 3 days of cough, chest tightness, nasal congestion, postnasal drip, and sore throat after coughing.  She does have a history of asthma and uses her inhaler but she is run out.  She also has seasonal allergies and uses Flonase.  She has been vaccinated x2 but no booster.  She has not received her flu shot.  No COVID exposure that she is aware of.  She did have COVID back in Christmas of 2021.  She denies any fever shakes chills.  No nausea vomiting or diarrhea.  No abdominal or urinary symptoms.  She denies any significant chest pain or shortness of breath.  She has been wheezing and is concerned about potential progression to pneumonia.  That said, no red flag signs or symptoms elicited on history.       Past Medical History:  Diagnosis Date  . Hyperlipidemia   . Hypertension     Patient Active Problem List   Diagnosis Date Noted  . BP (high blood pressure) 09/28/2015  . 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 Adult BMI 30+ 09/28/2015    History reviewed. No pertinent surgical history.  OB History   No obstetric history on file.      Home Medications    Prior to Admission medications   Medication Sig Start Date End Date Taking? Authorizing Provider  predniSONE (DELTASONE) 20 MG tablet Take 1 tablet (20 mg total) by mouth 2 (two) times daily with a meal. 03/04/21  Yes Delton See, MD  promethazine-dextromethorphan (PROMETHAZINE-DM) 6.25-15 MG/5ML syrup Take 5 mLs by mouth 4  (four) times daily as needed for cough. 03/04/21  Yes Delton See, MD  albuterol (VENTOLIN HFA) 108 (90 Base) MCG/ACT inhaler Inhale 2 puffs into the lungs every 6 (six) hours as needed for wheezing or shortness of breath. 03/04/21   Delton See, MD  amLODipine (NORVASC) 10 MG tablet Take 1 tablet by mouth daily. Dr Elmer Ramp 11/24/14   [provider]  atenolol (TENORMIN) 50 MG tablet Take 1 tablet by mouth daily. 11/24/14 10/23/16  [provider]  azithromycin (ZITHROMAX Z-PAK) 250 MG tablet 2 tabs po once day 1, then 1 tab po qd for next 4 days 03/04/21   Delton See, MD  hydrochlorothiazide (HYDRODIURIL) 25 MG tablet Take 1 tablet by mouth daily. 11/24/14 10/23/16  [provider]  ondansetron (ZOFRAN ODT) 8 MG disintegrating tablet Take 1 tablet (8 mg total) by mouth every 8 (eight) hours as needed for nausea or vomiting. 10/23/16   Domenick Gong, MD  telmisartan (MICARDIS) 80 MG tablet Take 1 tablet by mouth daily. 11/24/14 10/23/16  [provider]    Family History Family History  Problem Relation Age of Onset  . Hypertension Mother   . Diabetes Son     Social History Social History   Tobacco Use  . Smoking status: Former Games developer  . Smokeless tobacco: Never Used  Substance Use Topics  . Alcohol  use: No    Alcohol/week: 0.0 standard drinks  . Drug use: No     Allergies   Tetracyclines & related   Review of Systems Review of Systems  Constitutional: Negative for chills, diaphoresis and fever.  HENT: Positive for congestion, postnasal drip and sore throat. Negative for ear pain, rhinorrhea, sinus pressure, sinus pain and sneezing.   Eyes: Negative for pain.  Respiratory: Positive for cough, chest tightness and wheezing. Negative for shortness of breath and stridor.   Cardiovascular: Negative for chest pain and palpitations.  Gastrointestinal: Negative for abdominal pain, diarrhea, nausea and vomiting.  Genitourinary: Negative for dysuria.   Musculoskeletal: Negative for arthralgias, back pain and myalgias.  Skin: Negative.  Negative for color change, pallor, rash and wound.  Neurological: Negative for dizziness, syncope, light-headedness and headaches.     Physical Exam Triage Vital Signs ED Triage Vitals  Enc Vitals Group     BP 03/04/21 1143 124/67     Pulse Rate 03/04/21 1143 78     Resp 03/04/21 1143 18     Temp 03/04/21 1143 98.3 F (36.8 C)     Temp src --      SpO2 03/04/21 1143 98 %     Weight --      Height --      Head Circumference --      Peak Flow --      Pain Score 03/04/21 1141 0     Pain Loc --      Pain Edu? --      Excl. in GC? --    No data found.  Updated Vital Signs BP 124/67 (BP Location: Left Arm)   Pulse 78   Temp 98.3 F (36.8 C)   Resp 18   LMP 09/06/2015 (Exact Date) Comment: denies preg  SpO2 98%   Visual Acuity Right Eye Distance:   Left Eye Distance:   Bilateral Distance:    Right Eye Near:   Left Eye Near:    Bilateral Near:     Physical Exam Vitals and nursing note reviewed.  Constitutional:      General: She is not in acute distress.    Appearance: Normal appearance. She is well-developed. She is not ill-appearing, toxic-appearing or diaphoretic.  HENT:     Head: Normocephalic and atraumatic.     Nose: Congestion present. No rhinorrhea.     Mouth/Throat:     Mouth: Mucous membranes are moist.     Pharynx: No pharyngeal swelling, oropharyngeal exudate or posterior oropharyngeal erythema.  Eyes:     General: No scleral icterus.       Right eye: No discharge.        Left eye: No discharge.     Extraocular Movements: Extraocular movements intact.     Conjunctiva/sclera: Conjunctivae normal.     Pupils: Pupils are equal, round, and reactive to light.  Neck:     Vascular: No hepatojugular reflux.     Trachea: No tracheal deviation.  Cardiovascular:     Rate and Rhythm: Normal rate and regular rhythm.  No extrasystoles are present.    Pulses: Normal pulses.  No decreased pulses.     Heart sounds: Normal heart sounds. No murmur heard. No friction rub. No gallop.   Pulmonary:     Effort: Pulmonary effort is normal. No accessory muscle usage or respiratory distress.     Breath sounds: No stridor. Examination of the right-upper field reveals wheezing. Examination of the left-upper field reveals wheezing. Examination of  the right-middle field reveals wheezing. Examination of the left-middle field reveals wheezing. Examination of the right-lower field reveals wheezing. Examination of the left-lower field reveals wheezing. Wheezing present. No decreased breath sounds, rhonchi or rales.     Comments: Diffuse wheeze heard in all lung fields Musculoskeletal:     Cervical back: Normal range of motion and neck supple.  Lymphadenopathy:     Cervical: Cervical adenopathy present.  Skin:    General: Skin is warm and dry.     Capillary Refill: Capillary refill takes less than 2 seconds.     Findings: No ecchymosis, erythema or rash.  Neurological:     General: No focal deficit present.     Mental Status: She is alert.  Psychiatric:        Mood and Affect: Mood normal.      UC Treatments / Results  Labs (all labs ordered are listed, but only abnormal results are displayed) Labs Reviewed - No data to display  EKG   Radiology No results found.  Procedures Procedures (including critical care time)  Medications Ordered in UC Medications - No data to display  Initial Impression / Assessment and Plan / UC Course  I have reviewed the triage vital signs and the nursing notes.  Pertinent labs & imaging results that were available during my care of the patient were reviewed by me and considered in my medical decision making (see chart for details).  Clinical impression: Cough, chest tightness, nasal congestion, wheezing and an asthmatic.  Clinically she has bronchitis.  Treatment plan: 1.  The findings and treatment plan were discussed in detail  with the patient.  Patient was in agreement. 2.  We will treat her for bronchitis.  Sent in a Z-Pak to her pharmacy.  Also for cough sent in promethazine dextromethorphan. 3.  She is asthmatic and she is wheezing on exam is on taper.  Renewed her albuterol as well. 4.  Educational handouts provided. 5.  Plenty of rest, plenty of fluids, Tylenol or Motrin for any fever or discomfort. 6.  Give her a work note just saying that she was seen today.  She go back to work on Monday. 7.  Continue with the Flonase for seasonal allergies. 8.  If symptoms persist please contact your primary care provider. 9.  If symptoms are worse given her asthma she should call 911 if she is having difficulty or go to the ER. 10.  She was stable on discharge and she will follow-up here as needed.    Final Clinical Impressions(s) / UC Diagnoses   Final diagnoses:  Bronchitis  Cough  Chest tightness  Nasal congestion  Wheezing  Seasonal allergies  Mild intermittent asthma, unspecified whether complicated     Discharge Instructions     As we discussed, your cough is secondary to bronchitis.  I am treating with antibiotic.  You are wheezing on exam and I am giving her some steroids.  You are going to need to use your inhaler and you tell me that you do not have a current one.  I have also renewed that for you as well.  Complicating her situation is your asthma diagnosis so if your symptoms get worse please call 911 or go to ER.  He also seasonal allergies so continue with the Flonase and the oral medications for this until we get out of the current pollen season. I gave you a work note saying that you were seen today go back to work Monday.  I  suspect she will be feeling much better.  If you are not please contact your primary care provider.  Again if you are worse go to the ER. Plenty of rest, plenty of fluids, Tylenol or Motrin for any fever or discomfort.    ED Prescriptions    Medication Sig Dispense Auth.  Provider   albuterol (VENTOLIN HFA) 108 (90 Base) MCG/ACT inhaler Inhale 2 puffs into the lungs every 6 (six) hours as needed for wheezing or shortness of breath. 1 each Delton See, MD   azithromycin (ZITHROMAX Z-PAK) 250 MG tablet 2 tabs po once day 1, then 1 tab po qd for next 4 days 6 each Delton See, MD   predniSONE (DELTASONE) 20 MG tablet Take 1 tablet (20 mg total) by mouth 2 (two) times daily with a meal. 14 tablet Delton See, MD   promethazine-dextromethorphan (PROMETHAZINE-DM) 6.25-15 MG/5ML syrup Take 5 mLs by mouth 4 (four) times daily as needed for cough. 180 mL Delton See, MD     PDMP not reviewed this encounter.   Delton See, MD 03/06/21 [phone]

## 2021-03-04 NOTE — ED Triage Notes (Signed)
Pt is present today with chest congestion, nasal congestion,  cough, and SOB. Pt states that her sx started a couple days ago.

## 2021-07-29 ENCOUNTER — Other Ambulatory Visit: Payer: Self-pay | Admitting: Gerontology

## 2021-07-29 DIAGNOSIS — Z1231 Encounter for screening mammogram for malignant neoplasm of breast: Secondary | ICD-10-CM

## 2022-11-30 ENCOUNTER — Other Ambulatory Visit: Payer: Self-pay | Admitting: Gerontology

## 2022-11-30 DIAGNOSIS — Z1231 Encounter for screening mammogram for malignant neoplasm of breast: Secondary | ICD-10-CM
